# Patient Record
Sex: Male | Born: 1990 | Hispanic: No | Marital: Single | Smoking: Former smoker
Health system: Southern US, Community
[De-identification: ages and names within clinical notes are randomized; demographics above are authoritative.]

## PROBLEM LIST (undated history)

## (undated) DIAGNOSIS — G43909 Migraine, unspecified, not intractable, without status migrainosus: Secondary | ICD-10-CM

---
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
## 2011-06-06 ENCOUNTER — Emergency Department: Payer: Self-pay | Admitting: Emergency Medicine

## 2013-07-08 IMAGING — CT CT HEAD WITHOUT CONTRAST
2 series · 16 of 30 positions shown, 20 images · non-contrast
Comparison: none

REASON FOR EXAM: right temporal headache, temporary loss of vision right
eye, left arm/leg numbne
COMMENTS:

[Series 2: without · axial · non-contrast · 0.40mm/px · z∈[+1218,+1344]mm · 13 of 40 slices shown, 17 images]
[im 3/40  brain]
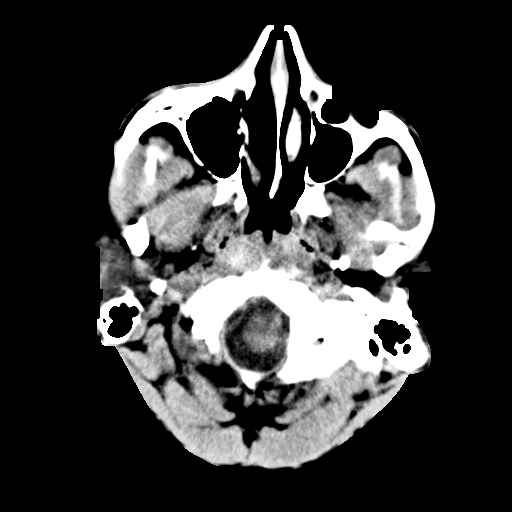
[im 3/40  bone]
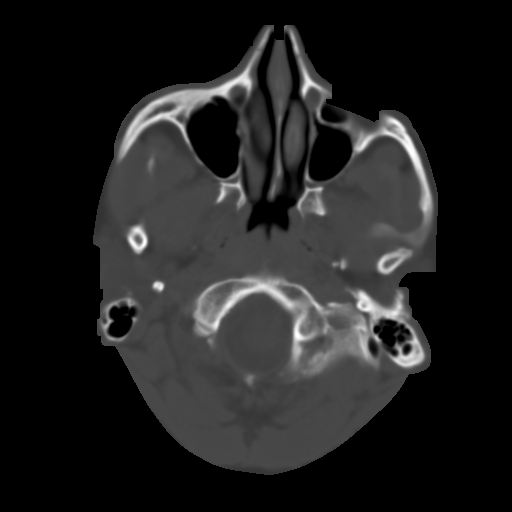
[im 6/40  brain]
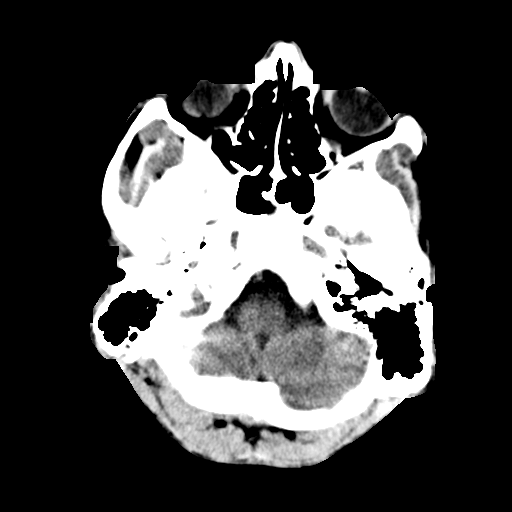
[im 9/40  brain]
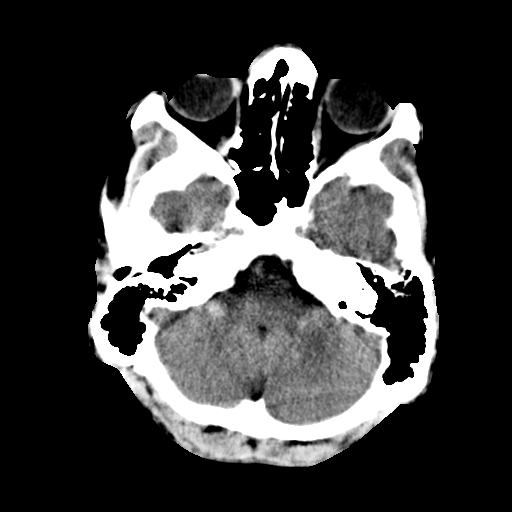
[im 12/40  brain]
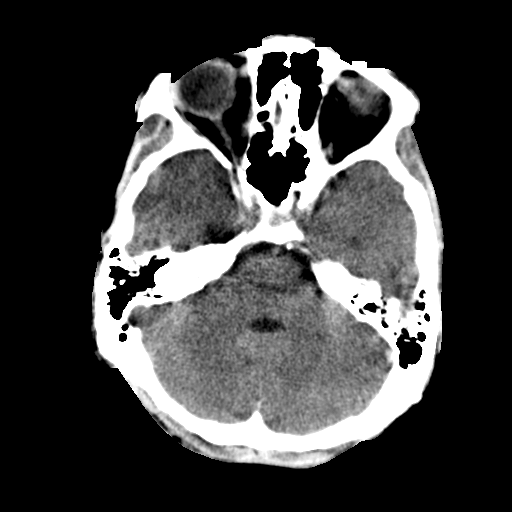
[im 14/40  brain]
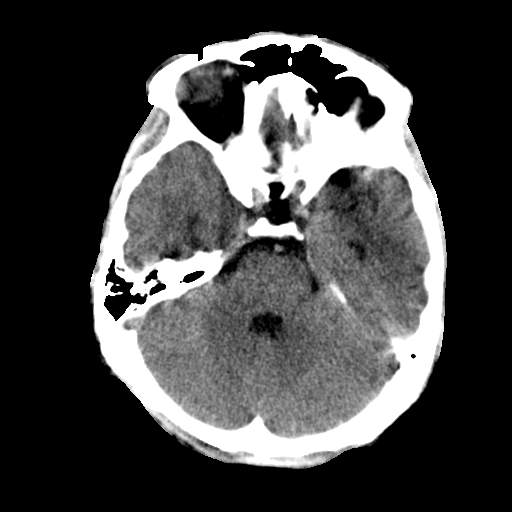
[im 14/40  bone]
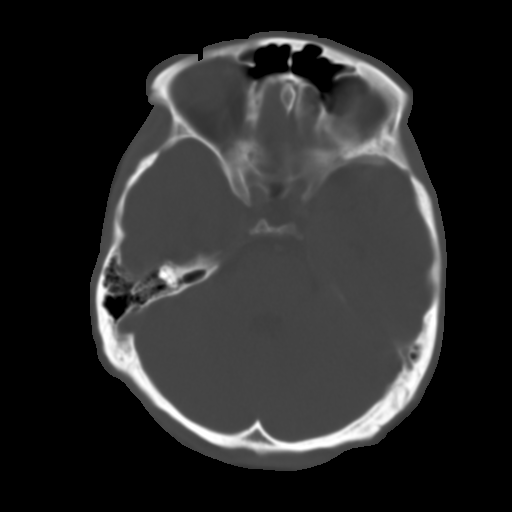
[im 17/40  brain]
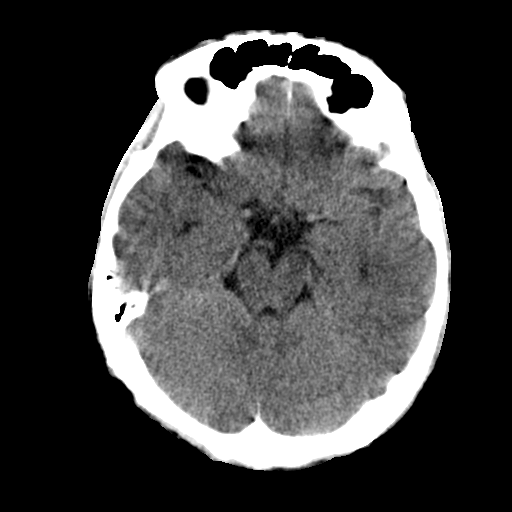
[im 20/40  brain]
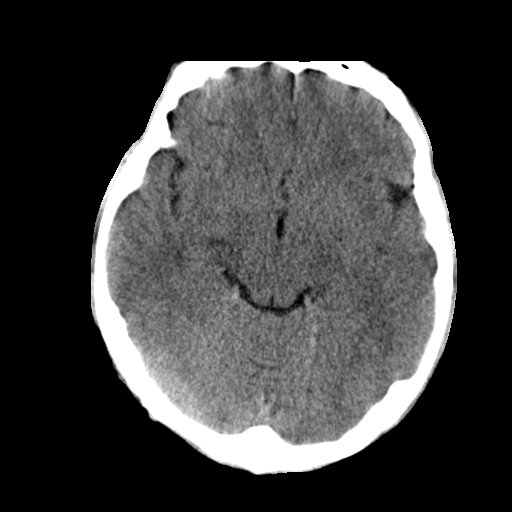
[im 23/40  brain]
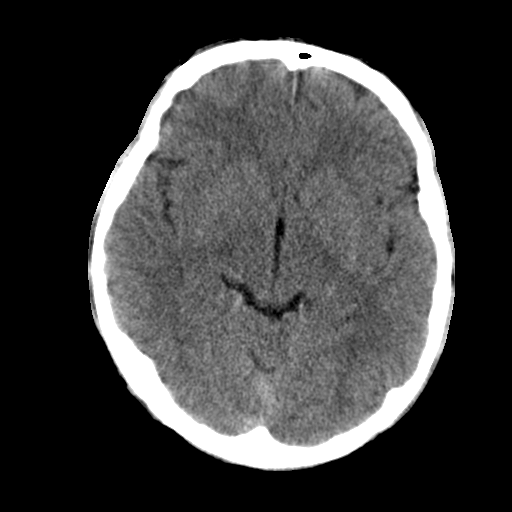
[im 26/40  brain]
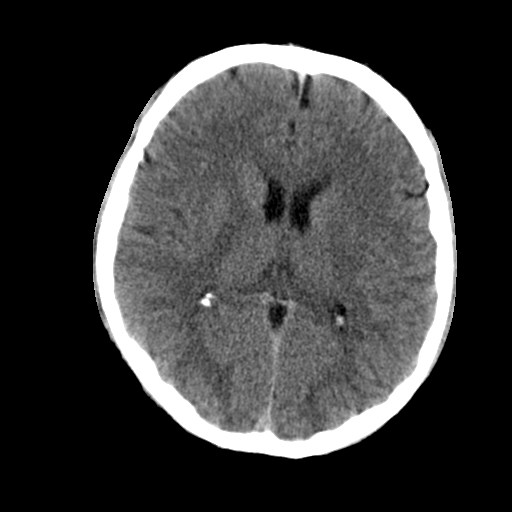
[im 26/40  bone]
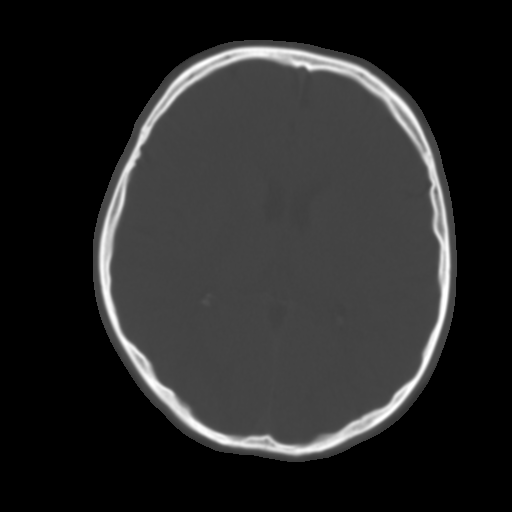
[im 28/40  brain]
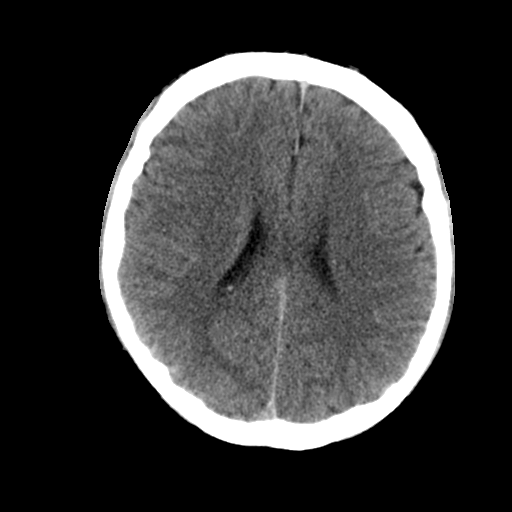
[im 31/40  brain]
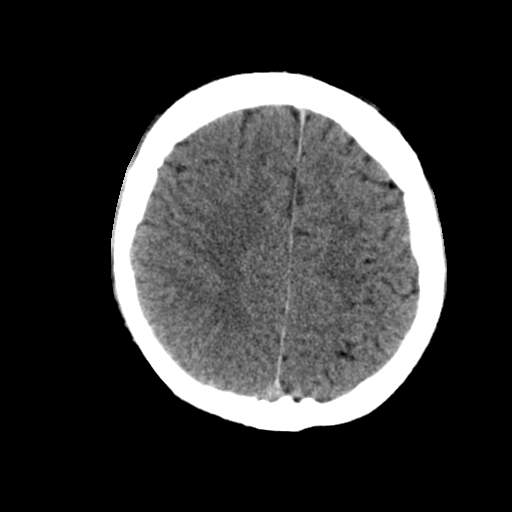
[im 34/40  brain]
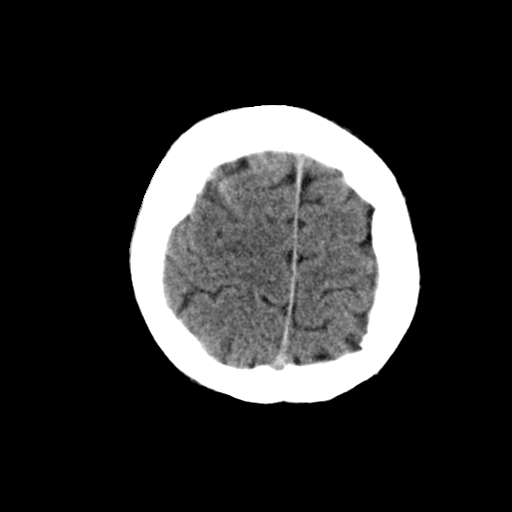
[im 37/40  brain]
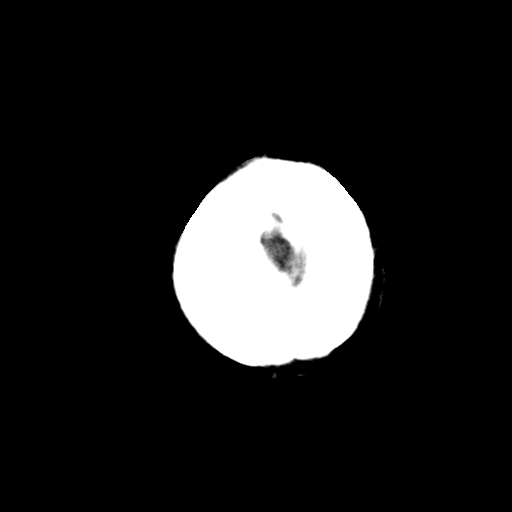
[im 37/40  bone]
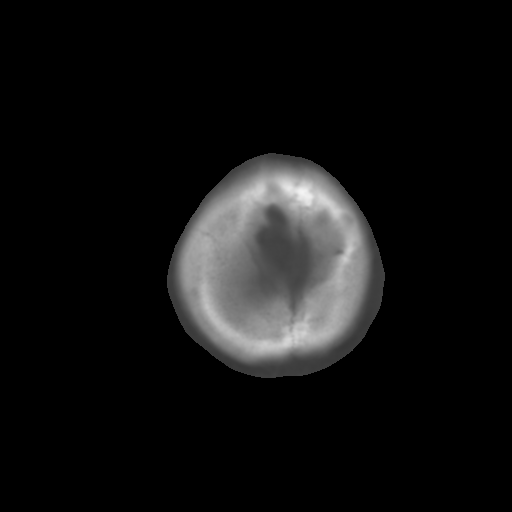

[Series 3: bone · axial · 0.40mm/px · z∈[+1218,+1252]mm · 3 of 40 slices shown]
[im 3/40  bone]
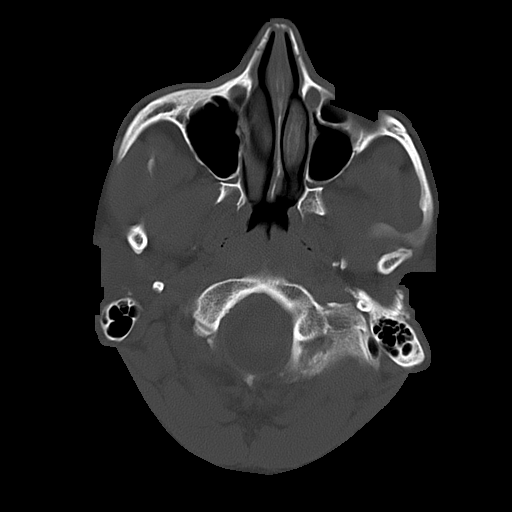
[im 9/40  bone]
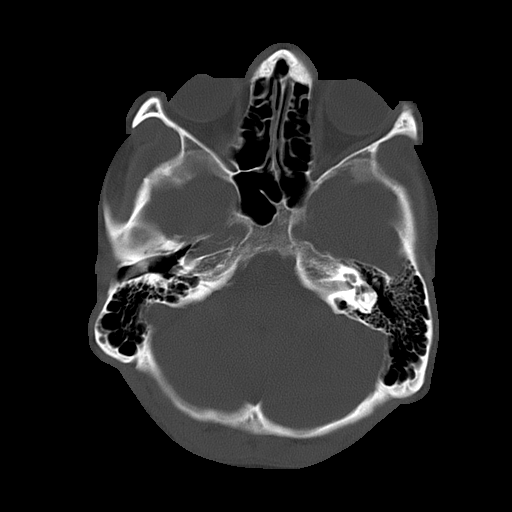
[im 14/40  bone]
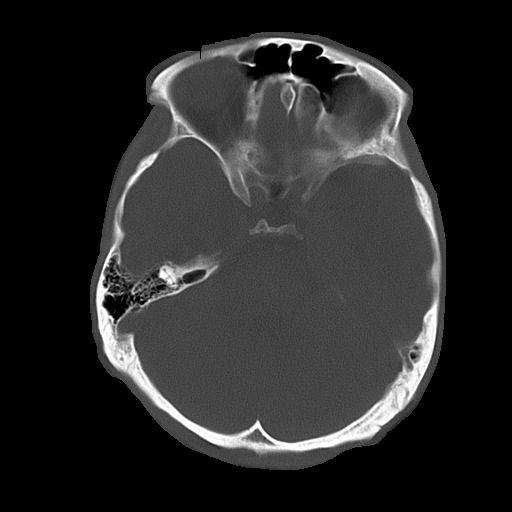

[16 of 30 positions shown; findings below may reference images not displayed]

PROCEDURE:     CT  - CT HEAD WITHOUT CONTRAST  - June 07, 2011  [DATE]

RESULT:     Axial noncontrast CT scanning was performed through the brain
with reconstructions at 5 mm intervals and slice thicknesses.

The ventricles are normal in size and position. There is no intracranial
hemorrhage nor intracranial mass effect. There is no evidence of an evolving
ischemic infarction. No abnormal intracranial calcifications are present.
The cerebellum and brainstem exhibit no acute abnormality. At bone window
settings the observed portions of the paranasal sinuses and mastoid air
cells are clear. There is no evidence of a skull fracture.
IMPRESSION: I see no acute intracranial abnormality.

A preliminary report was sent to the [HOSPITAL] the conclusion
of the study.

## 2018-03-30 ENCOUNTER — Emergency Department (HOSPITAL_COMMUNITY): Payer: No Typology Code available for payment source

## 2018-03-30 ENCOUNTER — Encounter (HOSPITAL_COMMUNITY): Payer: Self-pay | Admitting: Emergency Medicine

## 2018-03-30 ENCOUNTER — Emergency Department (HOSPITAL_COMMUNITY)
Admission: EM | Admit: 2018-03-30 | Discharge: 2018-03-31 | Disposition: A | Discharge status: Home / Self Care | Payer: No Typology Code available for payment source | Attending: Emergency Medicine | Admitting: Emergency Medicine

## 2018-03-30 DIAGNOSIS — S0990XA Unspecified injury of head, initial encounter: Secondary | ICD-10-CM | POA: Diagnosis not present

## 2018-03-30 DIAGNOSIS — Y999 Unspecified external cause status: Secondary | ICD-10-CM | POA: Insufficient documentation

## 2018-03-30 DIAGNOSIS — F1290 Cannabis use, unspecified, uncomplicated: Secondary | ICD-10-CM | POA: Diagnosis not present

## 2018-03-30 DIAGNOSIS — Y939 Activity, unspecified: Secondary | ICD-10-CM | POA: Insufficient documentation

## 2018-03-30 DIAGNOSIS — S0031XA Abrasion of nose, initial encounter: Secondary | ICD-10-CM | POA: Insufficient documentation

## 2018-03-30 DIAGNOSIS — R51 Headache: Secondary | ICD-10-CM | POA: Diagnosis not present

## 2018-03-30 DIAGNOSIS — Y929 Unspecified place or not applicable: Secondary | ICD-10-CM | POA: Insufficient documentation

## 2018-03-30 DIAGNOSIS — F17210 Nicotine dependence, cigarettes, uncomplicated: Secondary | ICD-10-CM | POA: Diagnosis not present

## 2018-03-30 HISTORY — DX: Migraine, unspecified, not intractable, without status migrainosus: G43.909
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
## 2018-03-31 NOTE — ED Notes (Signed)
Pt walking around room, pt waiting for dispo

## 2018-03-31 NOTE — ED Notes (Signed)
Pt found in waiting area, pt refusing to come back to room for IV removal and d/c instructions. Pt ambulatory with steady gait.

## 2020-11-25 ENCOUNTER — Ambulatory Visit: Payer: Self-pay

## 2020-11-29 ENCOUNTER — Other Ambulatory Visit: Payer: Self-pay

## 2020-11-29 ENCOUNTER — Ambulatory Visit: Payer: Self-pay | Admitting: Physician Assistant

## 2020-11-29 DIAGNOSIS — Z113 Encounter for screening for infections with a predominantly sexual mode of transmission: Secondary | ICD-10-CM

## 2020-11-29 LAB — HM HEPATITIS C SCREENING LAB: HM Hepatitis Screen: NEGATIVE

## 2020-11-29 LAB — GRAM STAIN

## 2020-11-29 LAB — HM HIV SCREENING LAB: HM HIV Screening: NEGATIVE

## 2020-11-30 ENCOUNTER — Encounter: Payer: Self-pay | Admitting: Physician Assistant

## 2020-11-30 NOTE — Progress Notes (Signed)
[facility] Department STI clinic/screening visit  Subjective:  Jesus Stephenson is a 30 y.o. male being seen today for an STI screening visit. The patient reports they do not have symptoms.    Patient has the following medical conditions:  There are no problems to display for this patient.    Chief Complaint  Patient presents with   SEXUALLY TRANSMITTED DISEASE    screening    HPI  Patient reports that he is not having any symptoms currently.  Reports that he will occasionally have some itching in his genital area but it is not persistent or going on currently.  Denies chronic conditions, surgeries and regular medicines.  Reports last HIV test was 2-3 years ago and last void prior to sample collection for Gram stain was over 2 hr ago.    See flowsheet for further details and programmatic requirements.    The following portions of the patient's history were reviewed and updated as appropriate: allergies, current medications, past medical history, past social history, past surgical history and problem list.  Objective:  There were no vitals filed for this visit.  Physical Exam Constitutional:      General: He is not in acute distress.    Appearance: Normal appearance.  HENT:     Head: Normocephalic and atraumatic.     Comments: No nits,lice, or hair loss. No cervical, supraclavicular or axillary adenopathy.     Mouth/Throat:     Mouth: Mucous membranes are moist.     Pharynx: Oropharynx is clear. No oropharyngeal exudate or posterior oropharyngeal erythema.  Eyes:     Conjunctiva/sclera: Conjunctivae normal.  Pulmonary:     Effort: Pulmonary effort is normal.  Abdominal:     Palpations: Abdomen is soft. There is no mass.     Tenderness: There is no abdominal tenderness. There is no guarding or rebound.  Genitourinary:    Penis: Normal.      Testes: Normal.     Comments: Pubic area without nits, lice, hair loss, edema, erythema, lesions and inguinal  adenopathy. Penis uncircumcised without rash, lesions and discharge at meatus. Testicles descended bilaterally,nt, no masses or edema.  Musculoskeletal:     Cervical back: Neck supple. No tenderness.  Skin:    General: Skin is warm and dry.     Findings: No bruising, erythema, lesion or rash.  Neurological:     Mental Status: He is alert and oriented to person, place, and time.  Psychiatric:        Mood and Affect: Mood normal.        Behavior: Behavior normal.        Thought Content: Thought content normal.        Judgment: Judgment normal.      Assessment and Plan:  Jesus Stephenson is a 30 y.o. male presenting to the [facility] Department for STI screening  1. Screening for STD (sexually transmitted disease) Patient into clinic without symptoms. Reviewed with patient that Gram stain results are normal and no treatment is indicated today. Counseled patient to try to pay attention to when he has itching as it may be related to changes in soap, detergent or lotions. Rec condoms with all sex. Await test results.  Counseled that RN will call if needs to RTC for treatment once results are back.  - Gram stain - Gonococcus culture - HIV/HCV [facility] - Syphilis Serology, [facility]     No follow-ups on file.  No future  appointments.  Matt Holmes, PA

## 2020-12-03 LAB — GONOCOCCUS CULTURE

## 2020-12-12 NOTE — Addendum Note (Signed)
Addended by: Heywood Bene on: 12/12/2020 09:19 AM   Modules accepted: Orders
